# Patient Record
Sex: Female | Born: 1985 | Race: Black or African American | Hispanic: No | Marital: Married
Health system: Southern US, Community
[De-identification: ages and names within clinical notes are randomized; demographics above are authoritative.]

---

## 2010-01-06 ENCOUNTER — Inpatient Hospital Stay: Payer: Self-pay | Admitting: Psychiatry

## 2010-11-08 ENCOUNTER — Emergency Department: Payer: Self-pay | Admitting: Emergency Medicine

## 2011-01-22 ENCOUNTER — Emergency Department: Payer: Self-pay | Admitting: Emergency Medicine

## 2011-01-22 LAB — COMPREHENSIVE METABOLIC PANEL
Albumin: 4 g/dL (ref 3.4–5.0)
Alkaline Phosphatase: 107 U/L (ref 50–136)
Calcium, Total: 9 mg/dL (ref 8.5–10.1)
Co2: 22 mmol/L (ref 21–32)
EGFR (Non-African Amer.): >60
SGOT(AST): 16 U/L (ref 15–37)
SGPT (ALT): 23 U/L

## 2011-01-22 LAB — CBC
HGB: 12.7 g/dL (ref 12.0–16.0)
MCHC: 33.1 g/dL (ref 32.0–36.0)
Platelet: 167 10*3/uL (ref 150–440)
RBC: 4.2 10*6/uL (ref 3.80–5.20)
RDW: 14.6 % — ABNORMAL HIGH (ref 11.5–14.5)
WBC: 9.8 10*3/uL (ref 3.6–11.0)

## 2011-01-22 LAB — HCG, QUANTITATIVE, PREGNANCY: Beta Hcg, Quant.: 10927 m[IU]/mL — ABNORMAL HIGH

## 2011-01-25 ENCOUNTER — Inpatient Hospital Stay: Payer: Self-pay | Admitting: Obstetrics and Gynecology

## 2011-01-25 LAB — CBC
HGB: 12.4 g/dL (ref 12.0–16.0)
MCH: 30.3 pg (ref 26.0–34.0)
MCHC: 33.2 g/dL (ref 32.0–36.0)
MCV: 91 fL (ref 80–100)
Platelet: 181 10*3/uL (ref 150–440)
RBC: 4.1 10*6/uL (ref 3.80–5.20)
WBC: 8.7 10*3/uL (ref 3.6–11.0)

## 2011-01-25 LAB — COMPREHENSIVE METABOLIC PANEL
Anion Gap: 11 (ref 7–16)
Calcium, Total: 8.9 mg/dL (ref 8.5–10.1)
Co2: 26 mmol/L (ref 21–32)
EGFR (African American): >60
EGFR (Non-African Amer.): >60
Osmolality: 277 (ref 275–301)
Potassium: 3.9 mmol/L (ref 3.5–5.1)
Sodium: 140 mmol/L (ref 136–145)

## 2011-01-25 LAB — URINALYSIS, COMPLETE
Bilirubin,UR: NEGATIVE
Glucose,UR: NEGATIVE mg/dL (ref 0–75)
Ph: 8 (ref 4.5–8.0)
Protein: 30
Specific Gravity: 1.019 (ref 1.003–1.030)
Squamous Epithelial: 1
WBC UR: <1 /HPF (ref 0–5)

## 2011-01-25 LAB — HCG, QUANTITATIVE, PREGNANCY: Beta Hcg, Quant.: 15477 m[IU]/mL — ABNORMAL HIGH

## 2011-01-30 LAB — PATHOLOGY REPORT

## 2011-06-05 IMAGING — US US OB < 14 WEEKS
1 series · 17 of 28 positions shown · non-contrast
Comparison: none

REASON FOR EXAM: suicide attempt; 6 weeks by LMP
COMMENTS:

[Series 1: us ob < 14 weeks · 17 of 178 slices shown]
[im 1/178]
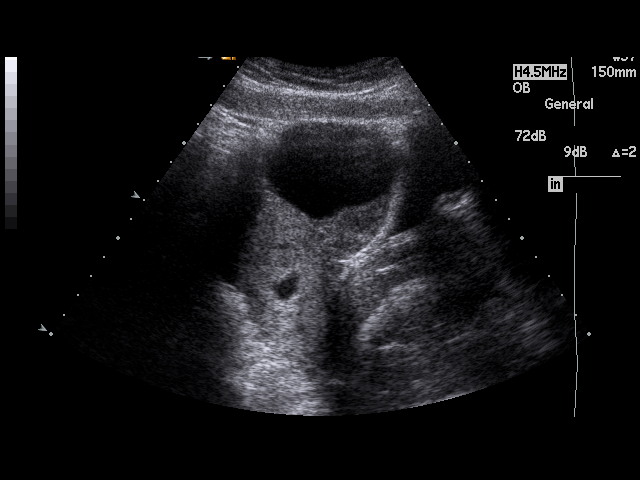
[im 14/178]
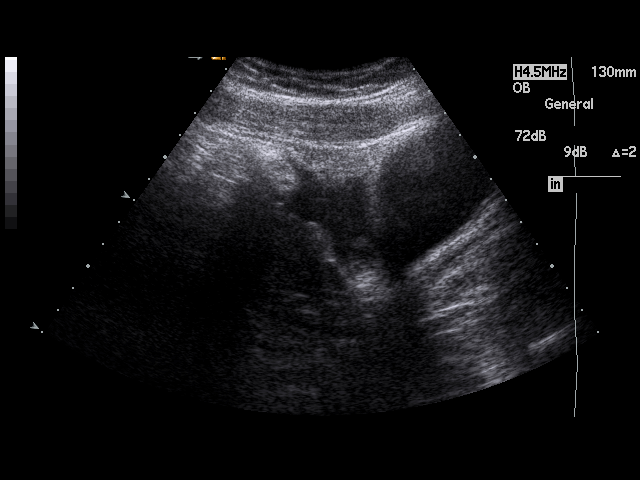
[im 27/178]
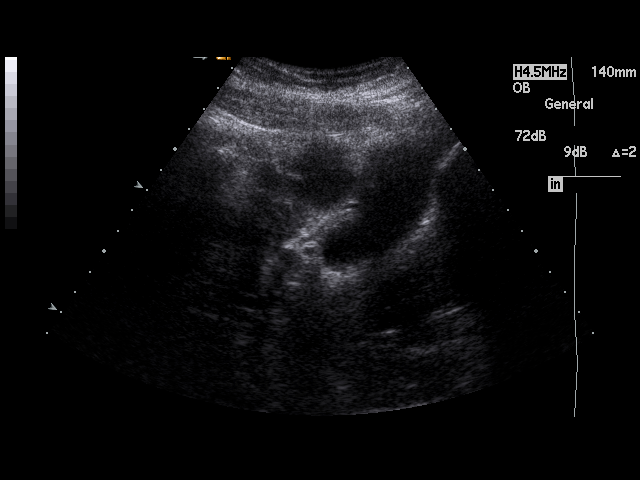
[im 33/178]
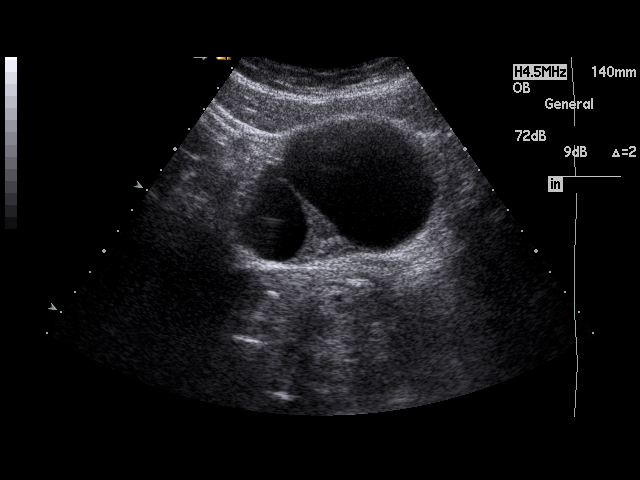
[im 46/178]
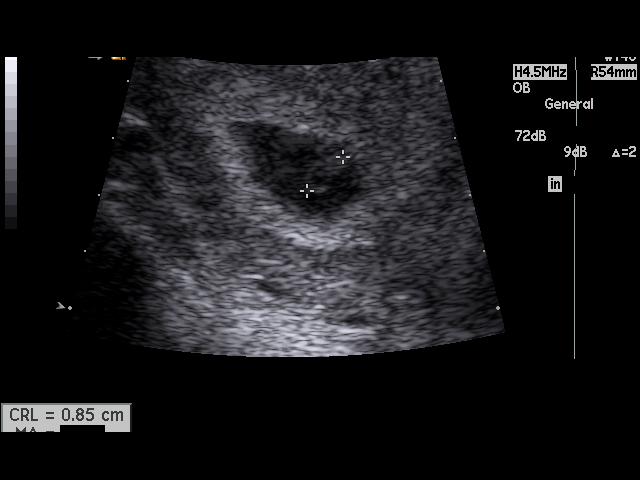
[im 60/178]
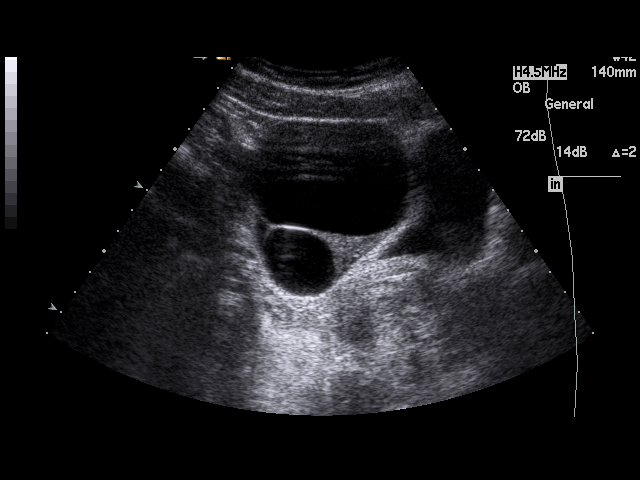
[im 66/178]
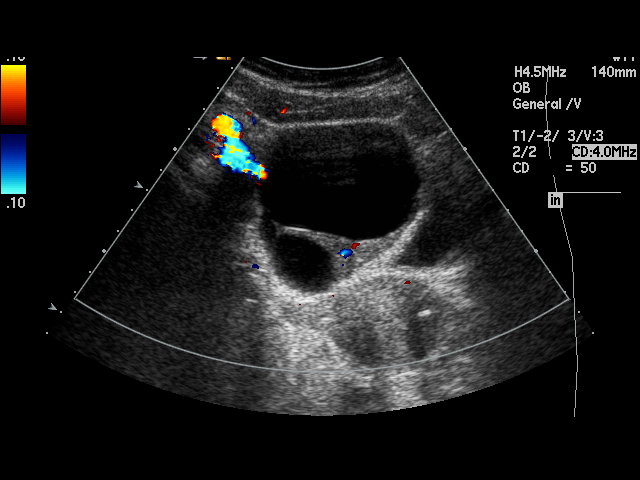
[im 79/178]
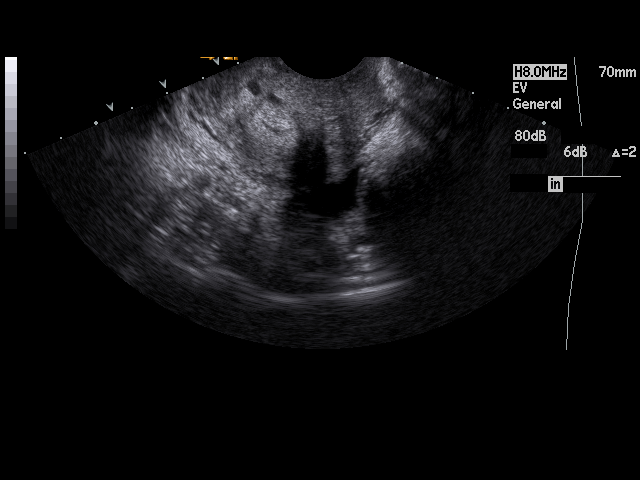
[im 92/178]
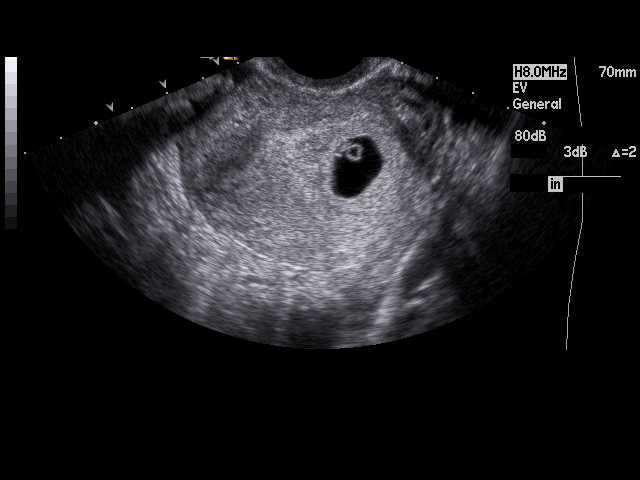
[im 99/178]
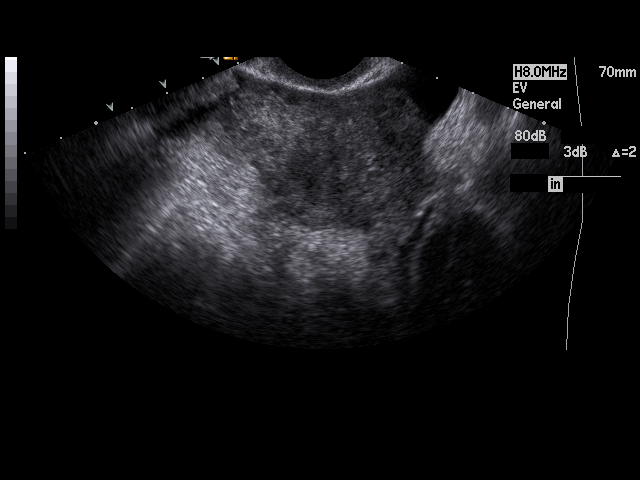
[im 112/178]
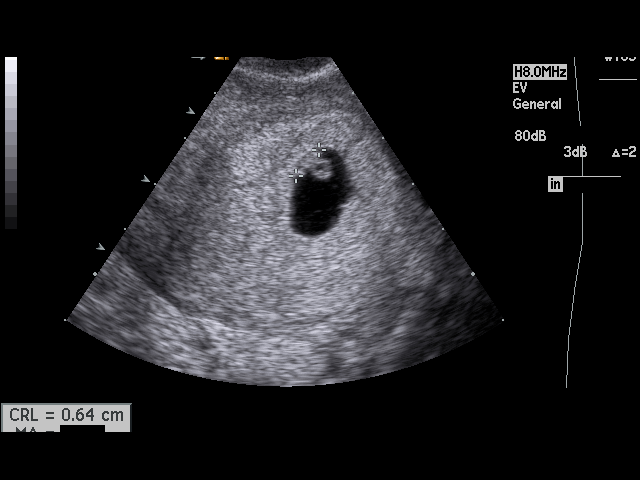
[im 119/178]
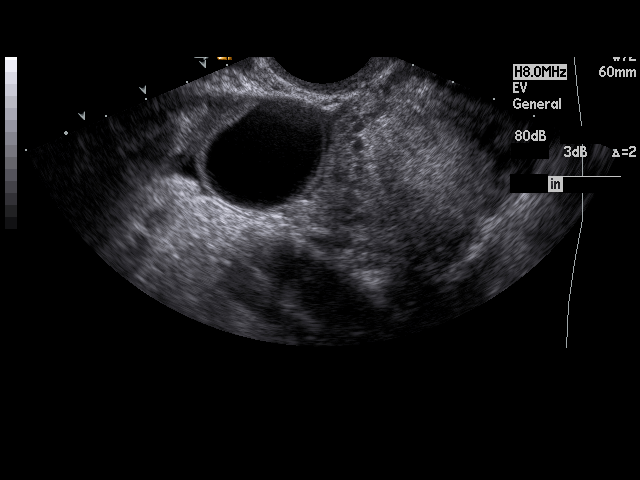
[im 132/178]
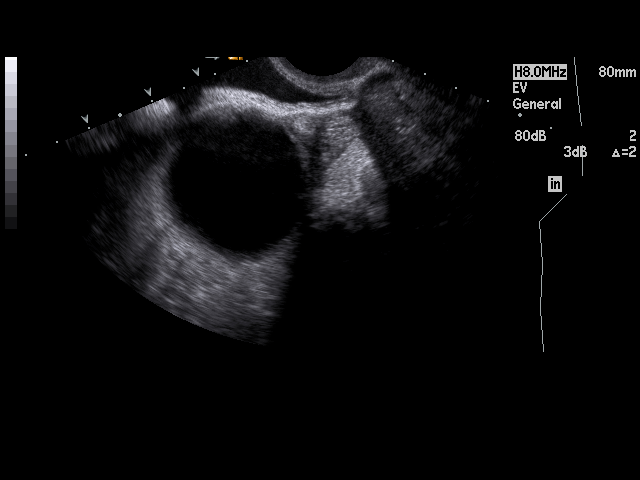
[im 145/178]
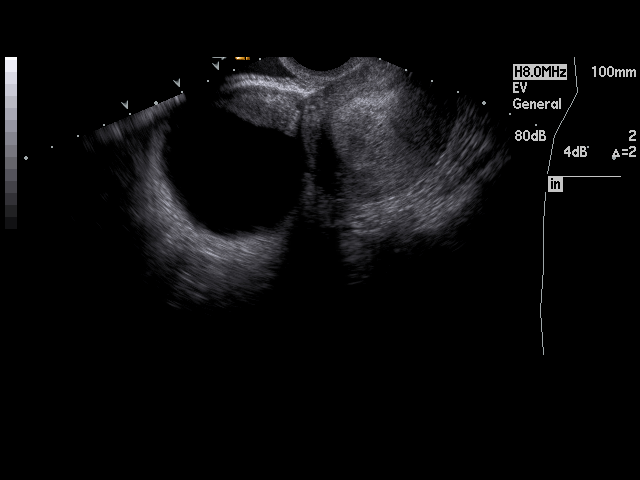
[im 151/178]
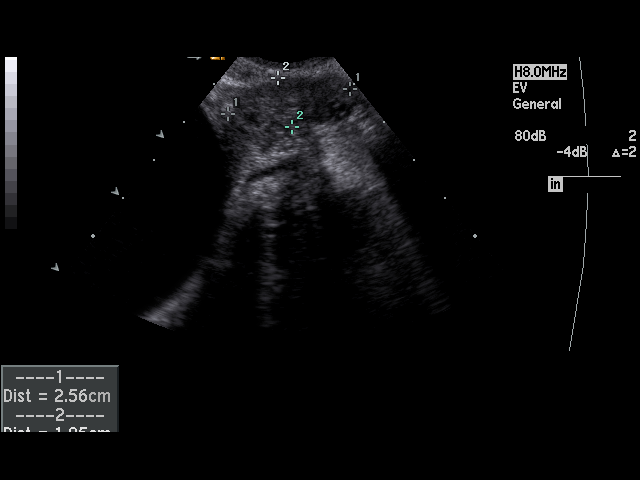
[im 164/178]
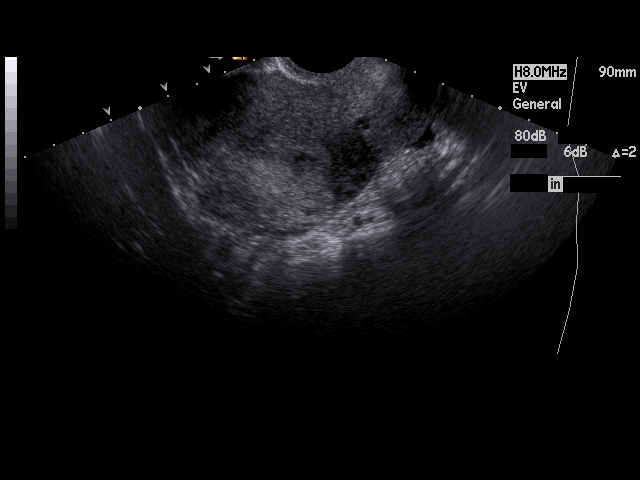
[im 178/178]
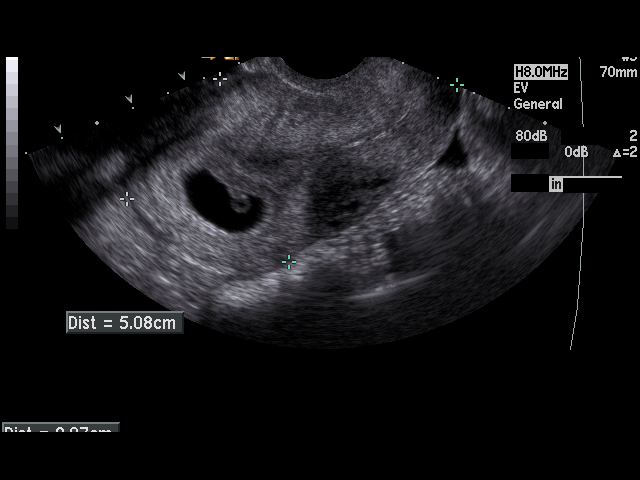

[17 of 28 positions shown; findings below may reference images not displayed]

PROCEDURE:     US  - US OB LESS THAN 14 WEEKS  - January 08, 2010 [DATE]

RESULT:     First trimester ultrasound is performed utilizing transabdominal
and endovaginal scanning. The uterus measures 7.85 x 3.96 x 6.01 cm and
contains what appears to be an early intrauterine gestational sac. The
maternal kidneys appear grossly normal. Transabdominal imaging demonstrates
a large cystic area of as much as 5.79 x 6.03 x 7.37 cm in the right adnexa
with a smaller cystic area present. Endovaginal scanning shows no definite
septations. Fetal heart rate is demonstrated at 124 beats per minute.
Crown-rump length averages 0.73 cm consistent with a 6 week-8 day gestation.
A yolk sac is present. The endovaginal images of the right adnexa show the
right ovary measures 5.5 x 4.2 x 7.1 cm and contains two cysts with the
larger measuring up to 5.8 x 5.5 x 5.9 cm with the smaller measuring 3.1 x
2.8 x 3.1 cm. The left ovary measures 2.6 x 1.1 x 2.4 cm.
IMPRESSION: 1.     Early intrauterine gestation. Follow-up is recommended.
2.     Simple appearing large cysts in the right ovary.

## 2012-06-21 IMAGING — US US OB < 14 WEEKS
1 series · 17 of 28 positions shown · non-contrast
Comparison: none

REASON FOR EXAM: Abdominal pain, vaginal bleeding
COMMENTS:   May transport without cardiac monitor

[Series 1: us ob < 14 weeks · 17 of 41 slices shown]
[im 1/41]
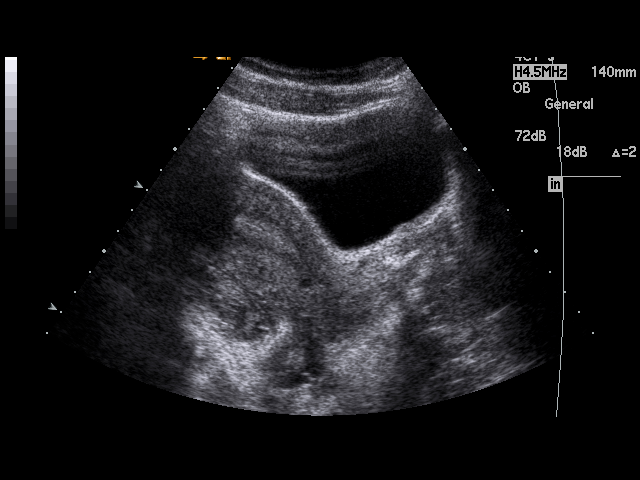
[im 3/41]
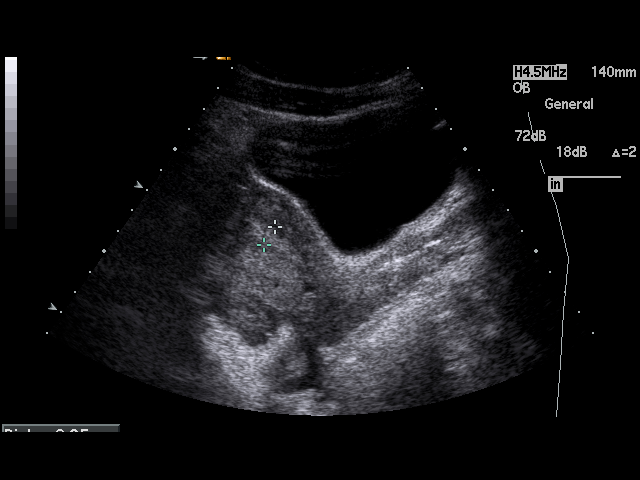
[im 6/41]
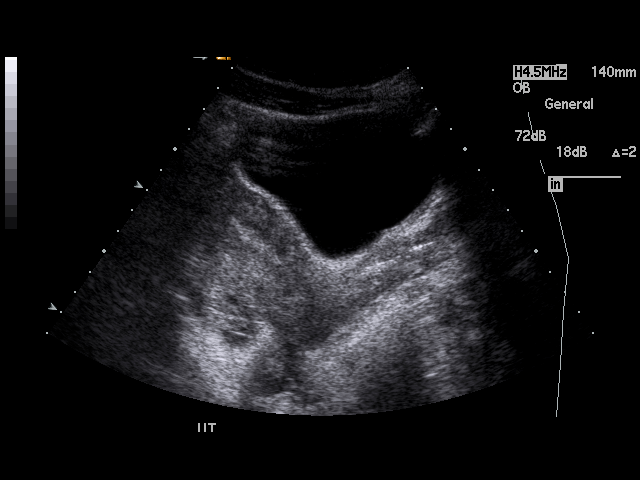
[im 8/41]
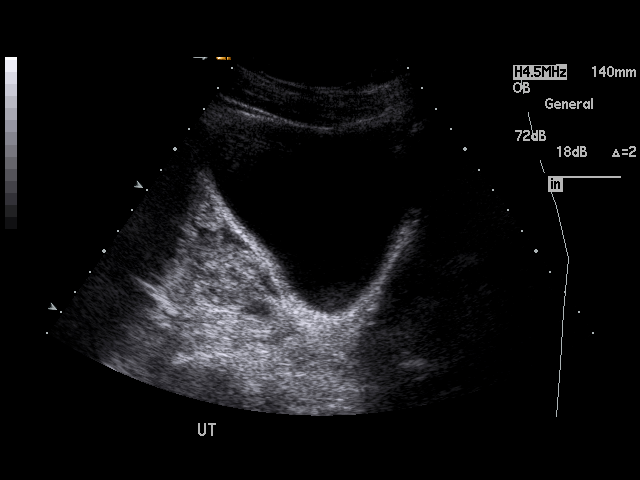
[im 11/41]
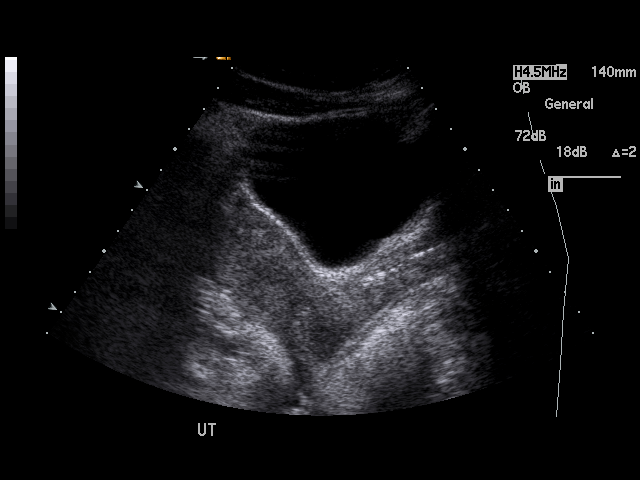
[im 14/41]
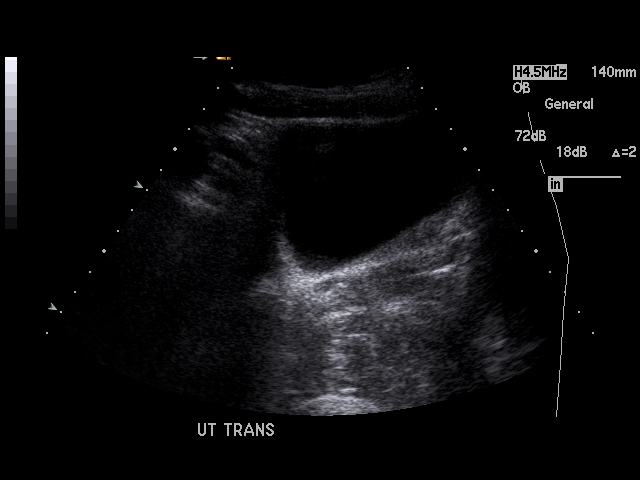
[im 15/41]
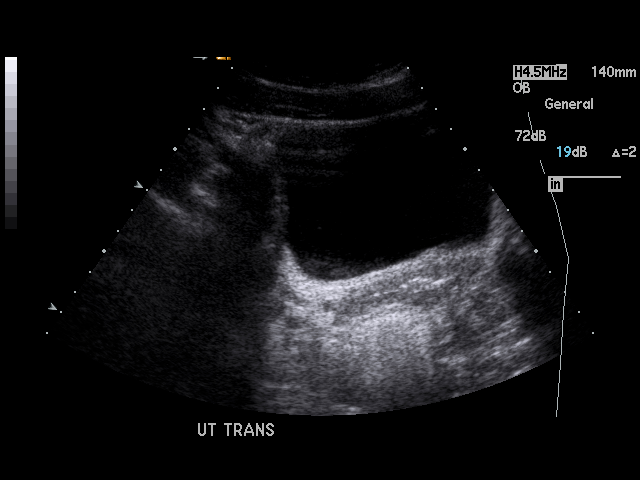
[im 18/41]
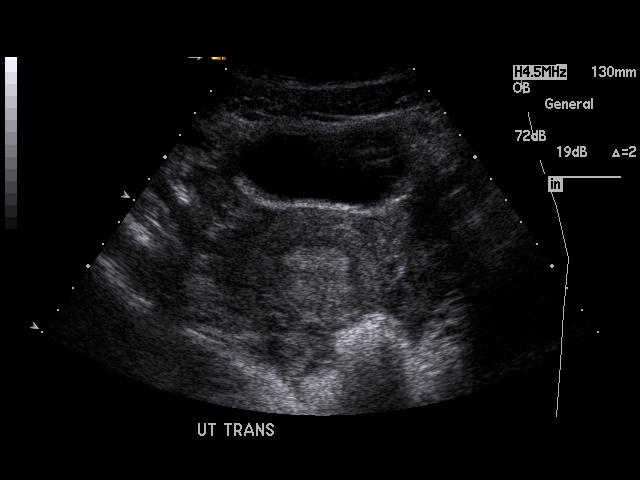
[im 21/41]
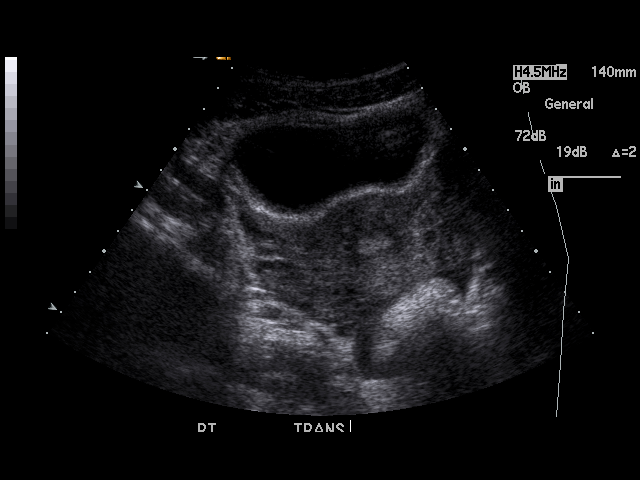
[im 23/41]
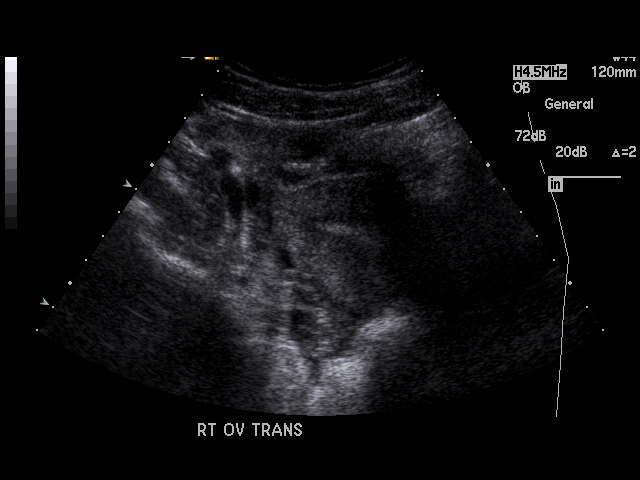
[im 26/41]
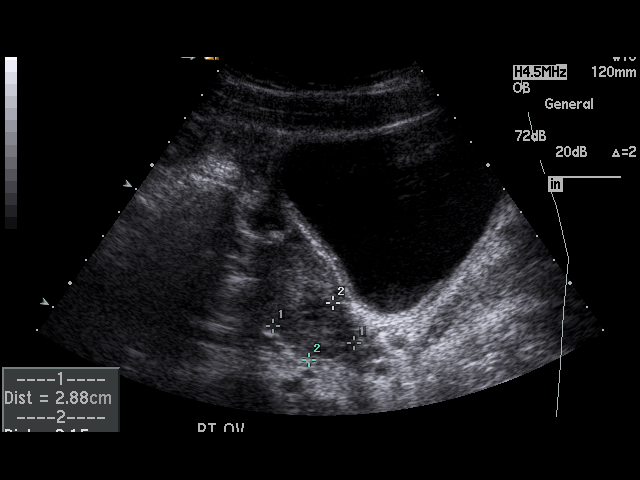
[im 27/41]
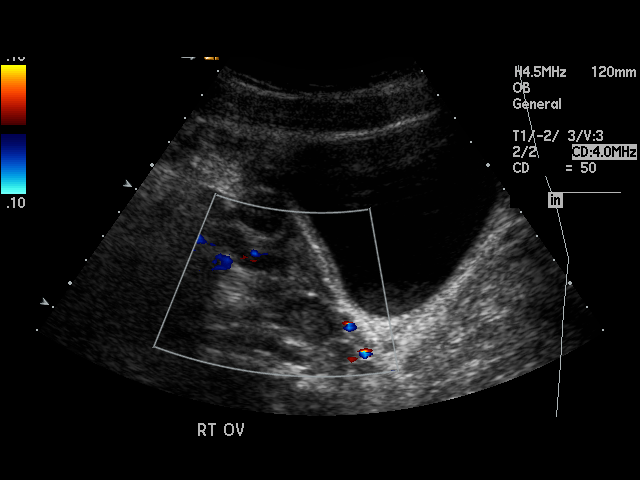
[im 30/41]
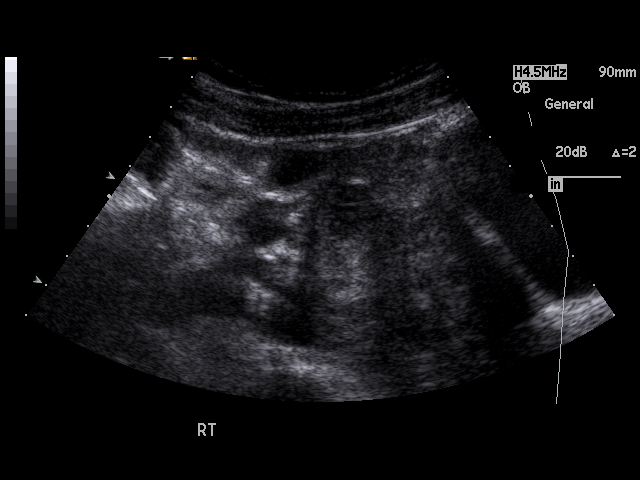
[im 33/41]
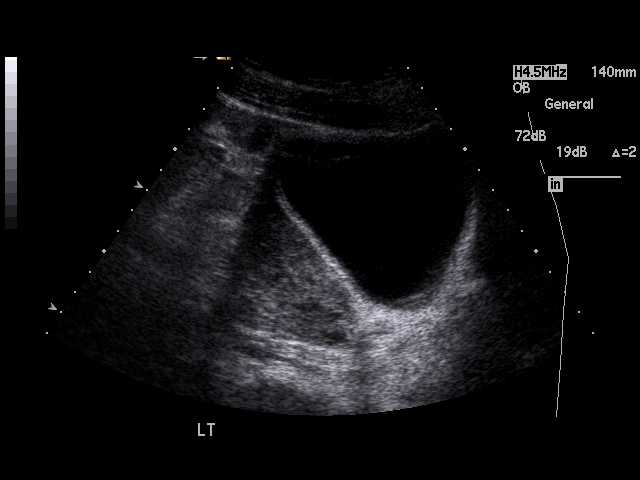
[im 35/41]
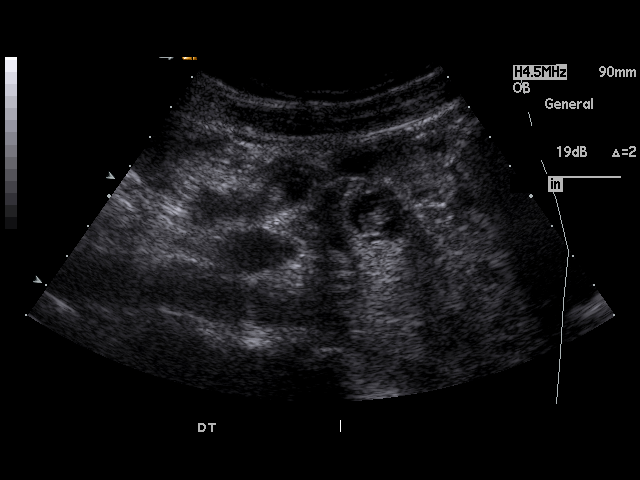
[im 38/41]
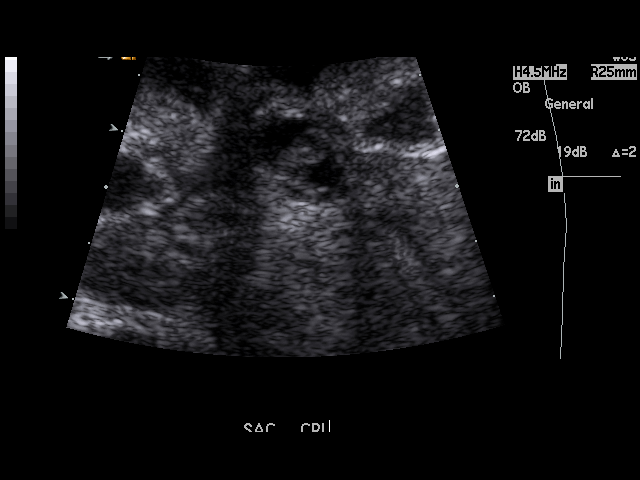
[im 41/41]
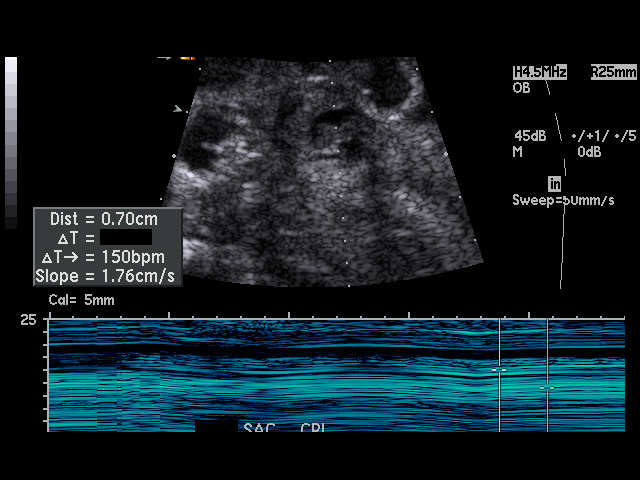

[17 of 28 positions shown; findings below may reference images not displayed]

PROCEDURE:     US  - US OB LESS THAN 14 WEEKS  - January 25, 2011  [DATE]

RESULT:     Emergent OB ultrasound performed with transabdominal  imaging
demonstrates no evidence of an intrauterine gestational sac or abnormal
intrauterine fluid collection. Endometrial stripe thickness is 6.7 mm. The
uterus has a length of 8.96 cm. There is evidence of fetal cardiac activity
in a fetal pole in a gestational sac in the region of the right adnexa.
Crown-rump length has a mean measurement of 1.02 cm consistent with 7 week
one day gestation. Fetal heart rate is seen at 150 beats per minute. There
does not appear to be significant free fluid.
IMPRESSION: 1. Findings consistent with a right adnexal ectopic gestation. The findings
were called by the technologist to Dr. Defrancesco in the [HOSPITAL]
the completion of the study. There is no evidence of free fluid within the
pelvis at this time.(*)

## 2013-05-16 ENCOUNTER — Emergency Department: Payer: Self-pay | Admitting: Emergency Medicine

## 2013-06-18 ENCOUNTER — Emergency Department: Payer: Self-pay | Admitting: Emergency Medicine

## 2013-10-04 ENCOUNTER — Emergency Department: Payer: Self-pay | Admitting: Emergency Medicine

## 2013-10-04 LAB — COMPREHENSIVE METABOLIC PANEL
ALBUMIN: 4.1 g/dL (ref 3.4–5.0)
Alkaline Phosphatase: 74 U/L
Anion Gap: 6 — ABNORMAL LOW (ref 7–16)
BILIRUBIN TOTAL: 0.3 mg/dL (ref 0.2–1.0)
BUN: 11 mg/dL (ref 7–18)
Calcium, Total: 8.4 mg/dL — ABNORMAL LOW (ref 8.5–10.1)
Chloride: 108 mmol/L — ABNORMAL HIGH (ref 98–107)
Co2: 26 mmol/L (ref 21–32)
Creatinine: 0.76 mg/dL (ref 0.60–1.30)
EGFR (African American): >60
GLUCOSE: 101 mg/dL — AB (ref 65–99)
Osmolality: 279 (ref 275–301)
Potassium: 3.4 mmol/L — ABNORMAL LOW (ref 3.5–5.1)
SGOT(AST): 19 U/L (ref 15–37)
SGPT (ALT): 30 U/L
SODIUM: 140 mmol/L (ref 136–145)
TOTAL PROTEIN: 7.6 g/dL (ref 6.4–8.2)

## 2013-10-04 LAB — CBC
HCT: 42 % (ref 35.0–47.0)
HGB: 13.4 g/dL (ref 12.0–16.0)
MCH: 30.2 pg (ref 26.0–34.0)
MCHC: 32 g/dL (ref 32.0–36.0)
MCV: 95 fL (ref 80–100)
PLATELETS: 202 10*3/uL (ref 150–440)
RBC: 4.45 10*6/uL (ref 3.80–5.20)
RDW: 13.7 % (ref 11.5–14.5)
WBC: 14.1 10*3/uL — ABNORMAL HIGH (ref 3.6–11.0)

## 2014-05-15 NOTE — Op Note (Signed)
PATIENT NAME:  Makayla Cole, Makayla Cole MR#:  [id number] DATE OF BIRTH:  01/05/1986  DATE OF PROCEDURE:  01/26/2011  PREOPERATIVE DIAGNOSIS: Right tubal pregnancy.   POSTOPERATIVE DIAGNOSIS: Right tubal pregnancy.   OPERATIVE PROCEDURE: Laparoscopic right salpingectomy.   SURGEON: Makayla DockerMartin A. Venita Seng, MD   FIRST ASSISTANT: None.   ANESTHESIA: General endotracheal.   INDICATIONS: The patient is a 29 year old African American female gravida 5, para 1-0-3-1, at approximately seven weeks gestation, admitted for surgical management of right tubal pregnancy. The patient presented to the Emergency Room with abdominal pain and bleeding with a positive pregnancy test. Last menstrual period was uncertain as she was nursing and subsequent work-up revealed the patient to have a 7.1 week embryo in the right adnexa with fetal cardiac activity. There was nothing within the uterus. The patient is Rh negative.   FINDINGS AT SURGERY: 150 mL hemoperitoneum. There was a 4 x 2 x 2 cm length tubal mass, unruptured. Uterus, ovaries, and left fallopian tube were normal.   DESCRIPTION OF PROCEDURE: The patient was brought to the operating room where she was placed in the supine position. General endotracheal anesthesia was induced without difficulty. She was placed in the low lithotomy position using bumblebee stirrups. A ChloraPrep and Betadine abdominal, perineal intravaginal prep and drape was performed in the standard fashion. Speculum was placed into the vagina and a Hulka tenaculum was placed onto the cervix. The uterus did sound to 9 cm. There was a palpable right adnexal mass notable on bimanual exam. Once the Hulka tenaculum was placed, the abdominal portion of the surgery was performed. A subumbilical incision 5 mm in length was made following administration of Sensorcaine local anesthetic. Direct entry into the abdomen was made with a 5 mm port using the Optiview laparoscopic trocar system. No bowel or vascular injury  was encountered. An 11 mm port was placed in the right lower quadrant and a 5 mm port was placed in the left lower quadrant respectively under direct visualization. The above-noted findings were photo documented. The pneumoperitoneum was removed using the Stryker irrigator aspirator. The ACE Harmonic scalpel was then used to remove the ectopic pregnancy. The mesosalpinx on the right was clamped, desiccated, and cut in the standard fashion with the entire tube being removed. Good hemostasis was obtained. The specimen was placed in an EndoCatch bag and removed in a standard fashion. The pelvis was copiously irrigated. All irrigant fluid was aspirated. The pneumoperitoneum pressure was reduced to 7 mmHg and no significant bleeding was identified at the surgical site. The procedure was then terminated with all instrumentation being removed from the abdominal cavity. The 11 mm incision was closed with 0 Vicryl on the fascia and 4-0 plain on the skin. Dermabond was used for closure of the skin incisions. The patient was then awakened, extubated, and taken to the recovery room in satisfactory condition.   Estimated blood loss minimal. 150 mL hemoperitoneum was diagnosed intraoperatively. All instruments, needle, sponge counts were verified as correct.   ____________________________ Makayla DockerMartin A. Ritvik Mczeal, MD mad:drc D: 01/27/2011 20:36:31 ET T: 01/28/2011 08:10:49 ET JOB#: [id number]  cc: Daphine DeutscherMartin A. Destany Severns, MD, <Dictator> Makayla DockerMARTIN A Aireona Torelli MD ELECTRONICALLY SIGNED 02/18/2011 12:40

## 2014-05-15 NOTE — H&P (Signed)
PATIENT NAME:  Makayla Cole, Makayla Cole MR#:  [id number] DATE OF BIRTH:  [birth year]  DATE OF ADMISSION:  01/25/2011  HISTORY OF PRESENT ILLNESS:  Makayla NaegeliKeisha Ey is a 29 year old African American female, married, gravida 5, para 1-0-3-1, last menstrual period some time in November, currently breast-feeding, who is admitted with right ectopic pregnancy. The patient developed some bleeding over the past three days along with some local right lower quadrant pain. Subsequent ER evaluation has revealed an ectopic pregnancy in the right adnexa with fetal cardiac activity noted. The crown-rump length was consistent with seven-week one-day gestation. The fetal pole was 1.02 cm there is not any significant free peritoneal fluid. The uterus is empty. The patient is Rh-. In light of these findings, the patient is to proceed with surgical intervention for ectopic pregnancy.   PAST MEDICAL HISTORY:  Infrequent urinary tract infections.   PAST SURGICAL HISTORY: Therapeutic abortion times two.  PAST OB HISTORY: Para 1-0-3-1,  SAB times one, TAB times two, SVD times one.   PAST GYN HISTORY: No history of abnormal Pap smears. No history of STI or PID. No history of endometriosis.   SOCIAL HISTORY: The patient does not smoke. She does drink alcohol socially when not pregnant.  She denies drug use.   REVIEW OF SYSTEMS:  As noted in the History of Present Illness. [facility] systems updated and otherwise negative.    DRUG ALLERGIES:  None.  CURRENT MEDICATIONS:  None.  PHYSICAL EXAMINATION:  VITAL SIGNS:  Temperature 99.4,  heart rate 60, respiratory rate 18, blood pressure 147/92.   GENERAL: The patient is a pleasant, healthy-appearing African American female in no acute distress.Alert and oriented. Weight 176 lb, height 65.9 inches.  BMI 28.4.    HEENT: Normocephalic, atraumatic. Oropharynx clear.   NECK: Supple. No thyromegaly or adenopathy.   LUNGS: Clear.   HEART: Regular rate and rhythm without murmur, S3  or S4. Slight CVA tenderness.   ABDOMEN: Soft. No peritoneal signs. No guarding. There is mild tenderness in the right lower quadrant. No palpable masses. No organomegaly.   PELVIC: Deferred to surgery.   EXTREMITIES: Warm, dry.   SKIN: Without rash.   MUSCULOSKELETAL: Normal.  Review of ultrasound is notable for right ectopic pregnancy, crown-rump length was 1.02 cm, consistent with seven-week one-day gestation. Fetal heart rate appeared to be noted at 150 beats per minute. No free intraperitoneal fluid was noted. Endometrial stripe thickness was 6.7 mm.  LABORATORY DATA:   Comprehensive metabolic panel is normal.  Hemoglobin and hematocrit 12.4 and 37.4, platelet count 181,000. White blood cell count 8.7. Blood type A negative.   IMPRESSION: Right tubal pregnancy, unruptured.   PLAN:   1. Laparoscopic excision of right tubal pregnancy.  2. RhoGAM following surgery.   CONSENT NOTE: Patient is understanding of the planned procedure and is aware of and accepting of all surgical risks which include but are not limited to bleeding, infection, pelvic organ injury with need for repair, blood clot disorders, anesthesia risks, and death. The patient understands that loss of one tube will not necessarily decrease her fertility provided the other fallopian tube and ovary are normal. She is understanding of this and is ready and willing to proceed with surgery. All questions were answered.    ____________________________ Prentice DockerMartin A. DeFrancesco, MD mad:bjt D: 01/25/2011 22:00:44 ET T: 01/26/2011 08:42:58 ET JOB#: [id number]  cc: Daphine DeutscherMartin A. DeFrancesco, MD, <Dictator> Prentice DockerMARTIN A DEFRANCESCO MD ELECTRONICALLY SIGNED 01/26/2011 14:35
